# Patient Record
Sex: Male | Born: 1970 | Race: White | Hispanic: No | Marital: Single | Smoking: Former smoker
Health system: Southern US, Community
[De-identification: ages and names within clinical notes are randomized; demographics above are authoritative.]

## PROBLEM LIST (undated history)

## (undated) DIAGNOSIS — I10 Essential (primary) hypertension: Secondary | ICD-10-CM

## (undated) DIAGNOSIS — E785 Hyperlipidemia, unspecified: Secondary | ICD-10-CM

## (undated) HISTORY — DX: Hyperlipidemia, unspecified: E78.5

## (undated) HISTORY — PX: OTHER SURGICAL HISTORY: SHX169

## (undated) HISTORY — DX: Essential (primary) hypertension: I10

---

## 2000-03-05 ENCOUNTER — Encounter: Admission: RE | Admit: 2000-03-05 | Discharge: 2000-03-05 | Payer: Self-pay | Admitting: Family Medicine

## 2000-03-05 ENCOUNTER — Encounter: Payer: Self-pay | Admitting: Family Medicine

## 2001-07-22 ENCOUNTER — Emergency Department (HOSPITAL_COMMUNITY): Admission: EM | Admit: 2001-07-22 | Discharge: 2001-07-22 | Payer: Self-pay | Admitting: Emergency Medicine

## 2006-04-29 ENCOUNTER — Emergency Department (HOSPITAL_COMMUNITY): Admission: EM | Admit: 2006-04-29 | Discharge: 2006-04-29 | Payer: Self-pay | Admitting: *Deleted

## 2007-05-10 ENCOUNTER — Encounter: Admission: RE | Admit: 2007-05-10 | Discharge: 2007-05-10 | Payer: Self-pay | Admitting: Family Medicine

## 2008-05-31 ENCOUNTER — Emergency Department (HOSPITAL_BASED_OUTPATIENT_CLINIC_OR_DEPARTMENT_OTHER): Admission: EM | Admit: 2008-05-31 | Discharge: 2008-05-31 | Payer: Self-pay | Admitting: Emergency Medicine

## 2008-05-31 ENCOUNTER — Ambulatory Visit: Payer: Self-pay | Admitting: Radiology

## 2011-05-16 ENCOUNTER — Encounter: Payer: Self-pay | Admitting: *Deleted

## 2016-08-16 ENCOUNTER — Emergency Department (HOSPITAL_COMMUNITY)
Admission: EM | Admit: 2016-08-16 | Discharge: 2016-08-16 | Disposition: A | Discharge status: Home / Self Care | Payer: Self-pay | Attending: Emergency Medicine | Admitting: Emergency Medicine

## 2016-08-16 ENCOUNTER — Encounter (HOSPITAL_COMMUNITY): Payer: Self-pay | Admitting: *Deleted

## 2016-08-16 ENCOUNTER — Other Ambulatory Visit: Payer: Self-pay

## 2016-08-16 ENCOUNTER — Emergency Department (HOSPITAL_COMMUNITY): Payer: Self-pay

## 2016-08-16 DIAGNOSIS — R079 Chest pain, unspecified: Secondary | ICD-10-CM | POA: Insufficient documentation

## 2016-08-16 DIAGNOSIS — Z87891 Personal history of nicotine dependence: Secondary | ICD-10-CM | POA: Insufficient documentation

## 2016-08-16 DIAGNOSIS — I10 Essential (primary) hypertension: Secondary | ICD-10-CM | POA: Insufficient documentation

## 2016-08-16 LAB — BASIC METABOLIC PANEL
Anion gap: 8 (ref 5–15)
BUN: 9 mg/dL (ref 6–20)
CO2: 27 mmol/L (ref 22–32)
Calcium: 9.2 mg/dL (ref 8.9–10.3)
Chloride: 106 mmol/L (ref 101–111)
Creatinine, Ser: 0.86 mg/dL (ref 0.61–1.24)
Glucose, Bld: 99 mg/dL (ref 65–99)
POTASSIUM: 3.8 mmol/L (ref 3.5–5.1)
SODIUM: 141 mmol/L (ref 135–145)

## 2016-08-16 LAB — CBC
HEMATOCRIT: 45.3 % (ref 39.0–52.0)
Hemoglobin: 16.3 g/dL (ref 13.0–17.0)
MCH: 30.4 pg (ref 26.0–34.0)
MCHC: 36 g/dL (ref 30.0–36.0)
MCV: 84.5 fL (ref 78.0–100.0)
PLATELETS: 241 10*3/uL (ref 150–400)
RBC: 5.36 MIL/uL (ref 4.22–5.81)
RDW: 12.7 % (ref 11.5–15.5)
WBC: 5.6 10*3/uL (ref 4.0–10.5)

## 2016-08-16 LAB — URINALYSIS, ROUTINE W REFLEX MICROSCOPIC
BILIRUBIN URINE: NEGATIVE
Glucose, UA: NEGATIVE mg/dL
Hgb urine dipstick: NEGATIVE
Ketones, ur: NEGATIVE mg/dL
Leukocytes, UA: NEGATIVE
Nitrite: NEGATIVE
PROTEIN: NEGATIVE mg/dL
Specific Gravity, Urine: 1.011 (ref 1.005–1.030)
pH: 7 (ref 5.0–8.0)

## 2016-08-16 LAB — I-STAT TROPONIN, ED
TROPONIN I, POC: 0 ng/mL (ref 0.00–0.08)
Troponin i, poc: 0 ng/mL (ref 0.00–0.08)

## 2016-08-16 NOTE — ED Notes (Signed)
Encouraged pt to provide urine sample.

## 2016-08-16 NOTE — ED Provider Notes (Signed)
[facility] Provider Note   CSN: [id number] Arrival date & time: 08/16/16  [id number]     History   Chief Complaint No chief complaint on file.   HPI Gregory Villarreal is a 46 y.o. male with past medical history of hypertension and dyslipidemia.  HPI  Pt presenting with acute onset of chest tightness that began last night while at rest. Patient states that chest pain subsided before he went to bed. He states he woke up this morning and immediately felt chest tightness with radiation into his left arm, associated with shortness of breath and nausea. He states these have resolved, however the chest tightness is improved but still present. Patient states he has not been taking lisinopril for his hypertension, and has been treating with diet and exercise with success. He states this decision was supported by his primary care. However, last week while at the dentist he noted his blood pressure was 158/95. Yesterday his blood pressure is 150/100 began feeling ringing in his ears and a headache that is since resolved. He denies urinary frequency, back pain, vision changes, abdominal pain, lower extremity edema, headache, or any other symptoms. Denies history of DVT, recent travel, recent trauma or surgery.  Past Medical History:  Diagnosis Date  . Dyslipidemia   . Hypertension     There are no active problems to display for this patient.   Past Surgical History:  Procedure Laterality Date  . none         Home Medications    Prior to Admission medications   Medication Sig Start Date End Date Taking? Authorizing Provider  Artificial Tear Solution (OPTI-TEARS OP) Apply 1 drop to eye as needed (dry eyes).   Yes [provider]  aspirin EC 81 MG tablet Take 81 mg by mouth daily as needed for moderate pain.   Yes [provider]  lisinopril (PRINIVIL,ZESTRIL) 10 MG tablet Take 10 mg by mouth daily. 07/12/15  Yes [provider]  Multiple Vitamin (MULTIVITAMIN)  tablet Take 1 tablet by mouth daily.   Yes [provider]    Family History Family History  Problem Relation Age of Onset  . Stroke Unknown   . Hypertension Unknown   . Diabetes Unknown     Social History Social History  Substance Use Topics  . Smoking status: Former Smoker    Quit date: 01/23/2006  . Smokeless tobacco: Never Used  . Alcohol use 3.5 oz/week    7 Standard drinks or equivalent per week     Allergies   Patient has no known allergies.   Review of Systems Review of Systems  Constitutional: Negative for fever.  HENT: Negative for trouble swallowing.   Eyes: Negative for visual disturbance.  Respiratory: Positive for chest tightness and shortness of breath.   Cardiovascular: Positive for chest pain. Negative for leg swelling.  Gastrointestinal: Positive for nausea. Negative for abdominal pain and vomiting.  Genitourinary: Negative for dysuria and frequency.  Musculoskeletal: Negative for back pain.  Allergic/Immunologic: Negative for immunocompromised state.  Neurological: Negative for headaches.     Physical Exam Updated Vital Signs BP 108/74   Pulse 68   Temp 98 F (36.7 C) (Oral)   Resp 17   Ht 5\' 9"  (1.753 m)   Wt 88.5 kg (195 lb)   SpO2 98%   BMI 28.80 kg/m   Physical Exam  Constitutional: He appears well-developed and well-nourished.  Patient is well-appearing, resting comfortably in bed, not in distress.  HENT:  Head: Normocephalic  and atraumatic.  Mouth/Throat: Oropharynx is clear and moist.  Eyes: Pupils are equal, round, and reactive to light. Conjunctivae and EOM are normal.  Neck: Normal range of motion. Neck supple.  Cardiovascular: Normal rate, regular rhythm, normal heart sounds and intact distal pulses.  Exam reveals no gallop and no friction rub.   No murmur heard. Pulmonary/Chest: Effort normal and breath sounds normal. No respiratory distress. He has no wheezes. He has no rales. He exhibits no tenderness.    Abdominal: Soft. Bowel sounds are normal. He exhibits no distension. There is no tenderness. There is no rebound and no guarding.  Musculoskeletal:  No lower extremity edema  Neurological: He is alert.  Skin: Skin is warm. He is not diaphoretic.  Psychiatric: He has a normal mood and affect. His behavior is normal.  Nursing note and vitals reviewed.    ED Treatments / Results  Labs (all labs ordered are listed, but only abnormal results are displayed) Labs Reviewed  BASIC METABOLIC PANEL  CBC  URINALYSIS, ROUTINE W REFLEX MICROSCOPIC  I-STAT TROPONIN, ED  I-STAT TROPONIN, ED    EKG  EKG Interpretation  Date/Time:  Wednesday August 16 2016 12:58:31 EDT Ventricular Rate:  63 PR Interval:  160 QRS Duration: 102 QT Interval:  403 QTC Calculation: 413 R Axis:   54 Text Interpretation:  Sinus rhythm no acute changes  Confirmed by Crista CurbLiu, Dana [id number]) on 08/16/2016 1:41:12 PM       Radiology Dg Chest 2 View  Result Date: 08/16/2016 CLINICAL DATA:  Chest pain.  Shortness of breath . EXAM: CHEST  2 VIEW COMPARISON:  No recent prior . FINDINGS: Mediastinum and hilar structures normal. Lungs are clear. No pleural effusion or pneumothorax. Heart size normal. Degenerative changes thoracic spine with thoracic spine scoliosis . IMPRESSION: No acute abnormality. Electronically Signed   By: Maisie Fushomas  Register   On: 08/16/2016 08:58    Procedures Procedures (including critical care time)  Medications Ordered in ED Medications - No data to display   Initial Impression / Assessment and Plan / ED Course  I have reviewed the triage vital signs and the nursing notes.  Pertinent labs & imaging results that were available during my care of the patient were reviewed by me and considered in my medical decision making (see chart for details).     Patient with atypical chest pain. Cardiac workup is reassuring. Patient is to be discharged with recommendation to follow up with PCP in regards to  today's hospital visit. Chest pain is not likely of cardiac or pulmonary etiology d/t presentation, PERC negative, VSS, no tracheal deviation, no JVD or new murmur, RRR, breath sounds equal bilaterally, EKG without acute abnormalities x2, negative troponin x2, and negative CXR. Pt has been advised to return to the ED if CP becomes exertional, associated with diaphoresis or nausea, radiates to left jaw/arm, worsens or becomes concerning in any way. Pt encouraged to continue taking his lisinopril as prescribed. Pt appears reliable for follow up and is agreeable to discharge.   Case has been discussed with and seen by Dr. Verdie MosherLiu, who agrees with the above plan to discharge.   Discussed results, findings, treatment and follow up. Patient advised of return precautions. Patient verbalized understanding and agreed with plan.  Final Clinical Impressions(s) / ED Diagnoses   Final diagnoses:  Chest pain in adult    New Prescriptions Discharge Medication List as of 08/16/2016  1:39 PM       Russo, [place]azil[place] N, PA-C 08/16/16 1438  Lavera Guise, MD 08/16/16 [phone]

## 2016-08-16 NOTE — ED Notes (Signed)
Portable xray at bedside.

## 2016-08-16 NOTE — ED Triage Notes (Signed)
PT states was feeling "wierd" yesterday.  Today woke up with sob, pain and L arm pain.  Hx of htn that he has been controlling with diet and exercise.

## 2016-08-16 NOTE — Discharge Instructions (Signed)
Please read instructions below.  Follow up with your primary care provider within 1 week. Return to the ER for new or worsening symptoms; including worsening chest pain, shortness of breath, pain that radiates to the arm or neck, pain or shortness of breath worsened with exertion.

## 2016-09-06 ENCOUNTER — Telehealth: Payer: Self-pay | Admitting: Internal Medicine

## 2016-09-06 NOTE — Telephone Encounter (Signed)
[facility] [place] for appointment on 09/19/16 with Dr Rennis GoldenHilty.  Records put with Dr Blanchie DessertHilty's schedule for 09/19/16. lp

## 2016-09-09 ENCOUNTER — Emergency Department (HOSPITAL_COMMUNITY): Payer: Self-pay

## 2016-09-09 ENCOUNTER — Encounter (HOSPITAL_COMMUNITY): Payer: Self-pay | Admitting: *Deleted

## 2016-09-09 DIAGNOSIS — Z7982 Long term (current) use of aspirin: Secondary | ICD-10-CM | POA: Insufficient documentation

## 2016-09-09 DIAGNOSIS — Z79899 Other long term (current) drug therapy: Secondary | ICD-10-CM | POA: Insufficient documentation

## 2016-09-09 DIAGNOSIS — I10 Essential (primary) hypertension: Secondary | ICD-10-CM | POA: Insufficient documentation

## 2016-09-09 DIAGNOSIS — Z87891 Personal history of nicotine dependence: Secondary | ICD-10-CM | POA: Insufficient documentation

## 2016-09-09 DIAGNOSIS — R079 Chest pain, unspecified: Secondary | ICD-10-CM | POA: Insufficient documentation

## 2016-09-09 LAB — BASIC METABOLIC PANEL
ANION GAP: 9 (ref 5–15)
BUN: 11 mg/dL (ref 6–20)
CALCIUM: 9.1 mg/dL (ref 8.9–10.3)
CO2: 26 mmol/L (ref 22–32)
Chloride: 103 mmol/L (ref 101–111)
Creatinine, Ser: 0.88 mg/dL (ref 0.61–1.24)
Glucose, Bld: 94 mg/dL (ref 65–99)
POTASSIUM: 3.7 mmol/L (ref 3.5–5.1)
Sodium: 138 mmol/L (ref 135–145)

## 2016-09-09 LAB — CBC
HEMATOCRIT: 43.8 % (ref 39.0–52.0)
HEMOGLOBIN: 15.9 g/dL (ref 13.0–17.0)
MCH: 30.4 pg (ref 26.0–34.0)
MCHC: 36.3 g/dL — ABNORMAL HIGH (ref 30.0–36.0)
MCV: 83.7 fL (ref 78.0–100.0)
Platelets: 268 10*3/uL (ref 150–400)
RBC: 5.23 MIL/uL (ref 4.22–5.81)
RDW: 12.2 % (ref 11.5–15.5)
WBC: 7.9 10*3/uL (ref 4.0–10.5)

## 2016-09-09 LAB — I-STAT TROPONIN, ED: TROPONIN I, POC: 0.01 ng/mL (ref 0.00–0.08)

## 2016-09-09 NOTE — ED Triage Notes (Signed)
Pt has been having L chest pain, radiating to L arm, L ribcage, and L side of neck ongoing x 3 weeks. Pt has been seen here for same and followup with PCP as instructed; has appt with cardiologist on 8/28. Also endorses sob and nausea

## 2016-09-10 ENCOUNTER — Emergency Department (HOSPITAL_COMMUNITY)
Admission: EM | Admit: 2016-09-10 | Discharge: 2016-09-10 | Disposition: A | Discharge status: Home / Self Care | Payer: Self-pay | Attending: Emergency Medicine | Admitting: Emergency Medicine

## 2016-09-10 DIAGNOSIS — R079 Chest pain, unspecified: Secondary | ICD-10-CM

## 2016-09-10 LAB — I-STAT TROPONIN, ED: Troponin i, poc: 0 ng/mL (ref 0.00–0.08)

## 2016-09-10 NOTE — ED Notes (Signed)
ED Provider at bedside. 

## 2016-09-10 NOTE — ED Provider Notes (Signed)
[facility] Provider Note   CSN: [id number] Arrival date & time: 09/09/16  2103     History   Chief Complaint Chief Complaint  Patient presents with  . Chest Pain    HPI Gregory Villarreal is a 46 y.o. male.  The history is provided by the patient and medical records. No language interpreter was used.  Chest Pain   Pertinent negatives include no palpitations and no shortness of breath.   Gregory Villarreal is a 46 y.o. male  with a PMH of HTN, HLD who presents to the Emergency Department complaining of chest pain for the last 3 weeks. He was seen in the emergency department at initial onset where he received reassuring cardiac workup and was told to follow-up with his primary care provider. He did follow up on 8/26 where he was started on baby aspirin and Zantac and told to see a cardiologist. He has scheduled a cardiology appointment for 8/28. He describes his pain as a constant tightness that gets worse with movement and outpatient. That sometimes the pain will feel tighter and more severe Intermittently associated with a feeling of lightheadedness and radiation to the left arm. Not exertional. Today, he was just finishing nursing on the couch pain acutely worsened. Not associated with nausea, vomiting or diaphoresis. No back pain or trouble breathing. No medications taken prior to arrival for symptoms however he has been compliant with Zantac and ASA therapy.   Past Medical History:  Diagnosis Date  . Dyslipidemia   . Hypertension     There are no active problems to display for this patient.   Past Surgical History:  Procedure Laterality Date  . none         Home Medications    Prior to Admission medications   Medication Sig Start Date End Date Taking? Authorizing Provider  Artificial Tear Solution (OPTI-TEARS OP) Apply 1 drop to eye as needed (dry eyes).    [provider]  aspirin EC 81 MG tablet Take 81 mg by mouth daily as needed for moderate pain.     [provider]  lisinopril (PRINIVIL,ZESTRIL) 10 MG tablet Take 10 mg by mouth daily. 07/12/15   [provider]  Multiple Vitamin (MULTIVITAMIN) tablet Take 1 tablet by mouth daily.    [provider]    Family History Family History  Problem Relation Age of Onset  . Stroke Unknown   . Hypertension Unknown   . Diabetes Unknown     Social History Social History  Substance Use Topics  . Smoking status: Former Smoker    Quit date: 01/23/2006  . Smokeless tobacco: Never Used  . Alcohol use 3.5 oz/week    7 Standard drinks or equivalent per week     Allergies   Patient has no known allergies.   Review of Systems Review of Systems  Respiratory: Negative for shortness of breath.   Cardiovascular: Positive for chest pain. Negative for palpitations and leg swelling.  Neurological: Positive for light-headedness. Negative for syncope.  All other systems reviewed and are negative.    Physical Exam Updated Vital Signs BP 128/86   Pulse 65   Temp 98.3 F (36.8 C) (Oral)   Resp 14   SpO2 99%   Physical Exam  Constitutional: He is oriented to person, place, and time. He appears well-developed and well-nourished. No distress.  HENT:  Head: Normocephalic and atraumatic.  Neck: No JVD present.  Cardiovascular: Normal rate, regular rhythm and normal heart sounds.  No murmur heard. Pulmonary/Chest: Effort normal and breath sounds normal. No respiratory distress. He has no wheezes. He has no rales. He exhibits tenderness.  Abdominal: Soft. Bowel sounds are normal. He exhibits no distension. There is no tenderness.  Musculoskeletal: He exhibits no edema.  Neurological: He is alert and oriented to person, place, and time.  Skin: Skin is warm and dry.  Nursing note and vitals reviewed.    ED Treatments / Results  Labs (all labs ordered are listed, but only abnormal results are displayed) Labs Reviewed  CBC - Abnormal; Notable for the following:        Result Value   MCHC 36.3 (*)    All other components within normal limits  BASIC METABOLIC PANEL  I-STAT TROPONIN, ED  I-STAT TROPONIN, ED    EKG  EKG Interpretation  Date/Time:  Saturday September 09 2016 21:09:53 EDT Ventricular Rate:  63 PR Interval:  152 QRS Duration: 92 QT Interval:  410 QTC Calculation: 419 R Axis:   -18 Text Interpretation:  Normal sinus rhythm Normal ECG No significant change since last tracing Confirmed by Melene Plan [phone]) on 09/10/2016 12:44:23 AM       Radiology Dg Chest 2 View  Result Date: 09/09/2016 CLINICAL DATA:  Chest pain and shortness of breath EXAM: CHEST  2 VIEW COMPARISON:  August 16, 2016 FINDINGS: There is no edema or consolidation. The heart size and pulmonary vascularity are normal. No adenopathy. There is mild degenerative change in the thoracic spine. IMPRESSION: No edema or consolidation. Electronically Signed   By: Bretta Bang III M.D.   On: 09/09/2016 21:42    Procedures Procedures (including critical care time)  Medications Ordered in ED Medications - No data to display   Initial Impression / Assessment and Plan / ED Course  I have reviewed the triage vital signs and the nursing notes.  Pertinent labs & imaging results that were available during my care of the patient were reviewed by me and considered in my medical decision making (see chart for details).    Gregory Villarreal is a 46 y.o. male who presents to ED for constant chest pain x 3 weeks. Seen in ED on 7/25. Chart reviewed - reassuring work up. Then saw PCP who has started him on Zantac and 81 mg asa which he has been compliant with. Today, patient is afebrile, hemodynamically stable with tenderness to palpation along the chest wall. Labs reviewed and reassuring with negative troponin 2. Chest x-ray with no acute abnormalities. EKG non-ischemic. PERC negative. Low risk heart score. Patient's symptoms unlikely to be of cardiac etiology. Labs and imaging reviewed  again prior to discharge. Patient has been advised to return to the ED if development of any exertional chest pain, trouble breathing, new/worsening symptoms or for any additional concerns. Evaluation does not show pathology that would require ongoing emergent intervention or inpatient treatment. Encouraged to keep scheduled appointment with cards. Patient understands return precautions and follow up plan. All questions answered.   Final Clinical Impressions(s) / ED Diagnoses   Final diagnoses:  Chest pain with low risk for cardiac etiology    New Prescriptions New Prescriptions   No medications on file     Thelonious Kauffmann, Chase Picket, PA-C 09/10/16 0140    Melene Plan, DO 09/10/16 [phone]

## 2016-09-10 NOTE — Discharge Instructions (Signed)
It was my pleasure taking care of you today!   You were seen in the Emergency Department today for chest pain.  As we have discussed, today?s blood work and imagine are normal, but you may require further testing.  Keep your appointment with the cardiologist.   Continue to take your regular medications including Zantac and aspirin.   Return to the Emergency Department if you experience any exertional chest pain, difficulty breathing, sudden sweating, or other symptoms that concern you.

## 2016-09-19 ENCOUNTER — Ambulatory Visit (INDEPENDENT_AMBULATORY_CARE_PROVIDER_SITE_OTHER): Payer: Self-pay | Admitting: Internal Medicine

## 2016-09-19 ENCOUNTER — Encounter: Payer: Self-pay | Admitting: Internal Medicine

## 2016-09-19 VITALS — BP 126/84 | HR 58 | Ht 69.0 in | Wt 196.2 lb

## 2016-09-19 DIAGNOSIS — R0789 Other chest pain: Secondary | ICD-10-CM

## 2016-09-19 DIAGNOSIS — M7542 Impingement syndrome of left shoulder: Secondary | ICD-10-CM

## 2016-09-19 DIAGNOSIS — I10 Essential (primary) hypertension: Secondary | ICD-10-CM | POA: Insufficient documentation

## 2016-09-19 MED ORDER — IBUPROFEN 800 MG PO TABS: 800.0000 mg | ORAL_TABLET | Freq: Two times a day (BID) | ORAL | 0 refills | Status: AC

## 2016-09-19 NOTE — Progress Notes (Signed)
OFFICE CONSULT NOTE  Chief Complaint:  Chest pain  [facility] Phys[facility]: [facility]  HPI:  Gregory Villarreal is a 46 y.o. male who is being seen today for the evaluation of chest  at the request of Gregory Villarreal, *. Gregory Villarreal is a pleasant 46 year old male who recently is experiencing chest pain. He said that this initially started with some tenderness in the left chest over the left sternal costal margin. His symptoms are somewhat worse when taking a deep breath or lifting. He works in [employer] and does a lot of lifting of heavy books and items. He struggled with blood pressure for some time and was on and off of blood pressure medication due to changes in diet and exercise. Recently he went back on blood pressure medication for elevated blood pressures. He does have a history of stroke in his family, his dad who died of probably an embolic stroke from the carotids at age 46. In the past is told he has dyslipidemia but is not a medication for that. He reports some discomfort in his left arm and difficulty with grip strength in the left arm. He has some discomfort in the left shoulder. He cannot recall specific injury. He says he gets some numbness and tingling in his fingers and both hands, particularly after sleeping at night but realizes that he sleeps with his hands curled up words. This episode of chest discomfort he's had several times and is occluded very intense, sharp pain which is in the left chest sometimes radiating to the left shoulder and down the left arm. It was associated with some nausea, diaphoresis and tachycardia. He was also very anxious. He reports that he's had panic attacks in the past, but he feels that this is different. He's been to the emergency department several times a workup was negative with negative troponins and normal EKG. A recent episode included some left leg pain as well but he's had no further episodes of  that.  PMHx:  Past Medical History:  Diagnosis Date  . Dyslipidemia   . Hypertension     Past Surgical History:  Procedure Laterality Date  . none      FAMHx:  Family History  Problem Relation Age of Onset  . Stroke Unknown   . Hypertension Unknown   . Diabetes Unknown     SOCHx:   reports that he quit smoking about 10 years ago. He has never used smokeless tobacco. He reports that he drinks about 3.5 oz of alcohol per week . He reports that he does not use drugs.  ALLERGIES:  No Known Allergies  ROS: Pertinent items noted in HPI and remainder of comprehensive ROS otherwise negative.  HOME MEDS: Current Outpatient Prescriptions on File Prior to Visit  Medication Sig Dispense Refill  . Artificial Tear Solution (OPTI-TEARS OP) Apply 1 drop to eye as needed (dry eyes).    Marland Kitchen aspirin EC 81 MG tablet Take 81 mg by mouth daily as needed for moderate pain.    Marland Kitchen lisinopril (PRINIVIL,ZESTRIL) 10 MG tablet Take 10 mg by mouth daily.    . Multiple Vitamin (MULTIVITAMIN) tablet Take 1 tablet by mouth daily.     No current facility-administered medications on file prior to visit.     LABS/IMAGING: No results found for this or any previous visit (from the past 48 hour(s)). No results found.  LIPID PANEL: No results found for: CHOL, TRIG, HDL, CHOLHDL, VLDL, LDLCALC, LDLDIRECT  WEIGHTS:  Wt Readings from Last 3 Encounters:  09/19/16 196 lb 3.2 oz (89 kg)  08/16/16 195 lb (88.5 kg)    VITALS: BP 126/84   Pulse (!) 58   Ht 5\' 9"  (1.753 m)   Wt 196 lb 3.2 oz (89 kg)   BMI 28.97 kg/m   EXAM: General appearance: alert and no distress Neck: no JVD, supple, symmetrical, trachea midline and thyroid not enlarged, symmetric, no tenderness/mass/nodules Lungs: clear to auscultation bilaterally Heart: regular rate and rhythm Abdomen: soft, non-tender; bowel sounds normal; no masses,  no organomegaly Extremities: extremities normal, atraumatic, no cyanosis or edema and Pain on  palpation over the left sternal costal border, pain with extension and external rotation of the left shoulder with mild impingement Pulses: 2+ and symmetric Skin: Skin color, texture, turgor normal. No rashes or lesions Neurologic: Motor: 4 out of 5 strength in the left arm with extension, 5 out of 5 on the right Psych: Mildly anxious  EKG: Sinus bradycardia 58 - personally reviewed  ASSESSMENT: 1. Musculoskeletal pain-possible costochondritis or referred pain from left shoulder impingement 2. Family history of cerebrovascular disease 3. Hypertension  PLAN: 1.   Gregory Villarreal is describing a musculoskeletal pain. Is worse with deep breathing and the pain in the left chest is tender particularly there is point tenderness along the left sternal border. This could be neuropathic pain or possibly a costochondritis. He also has some left shoulder impingement with weakness in the left arm and may have a partial rotator cuff tear or strain. I'm advising no heavy lifting for the next couple of weeks and we'll start him on a trial of nonsteroidal anti-inflammatories. He should not take daily aspirin during this time. I've also advised him to use his Zantac daily for stomach protection. If his symptoms are not improved, I'm happy to see him back as needed but he should seek out an orthopedist for further evaluation of the shoulder.  Thanks for the kind referral.  Gregory Nose, MD, [facility]  [place]  [facility]  Attending Cardiologist  Direct Dial: [phone]  Fax: [phone]  Website:  [email or web address] Nicely Gregory Vill[facility]real 09/19/2016, 5:13 PM

## 2016-09-19 NOTE — Patient Instructions (Signed)
Your physician has recommended you make the following change in your medication:  -- START ibuprofen 800mg  twice daily - take for 2 weeks  -- hold aspirin while taking this  Your physician recommends that you schedule a follow-up appointment as needed with Dr. Rennis Golden

## 2018-07-04 IMAGING — CR DG CHEST 2V
2 series · 2 of 2 positions shown · non-contrast
Comparison: No recent prior .

CLINICAL DATA: Chest pain.  Shortness of breath .

EXAM:
CHEST  2 VIEW

[chest pa]
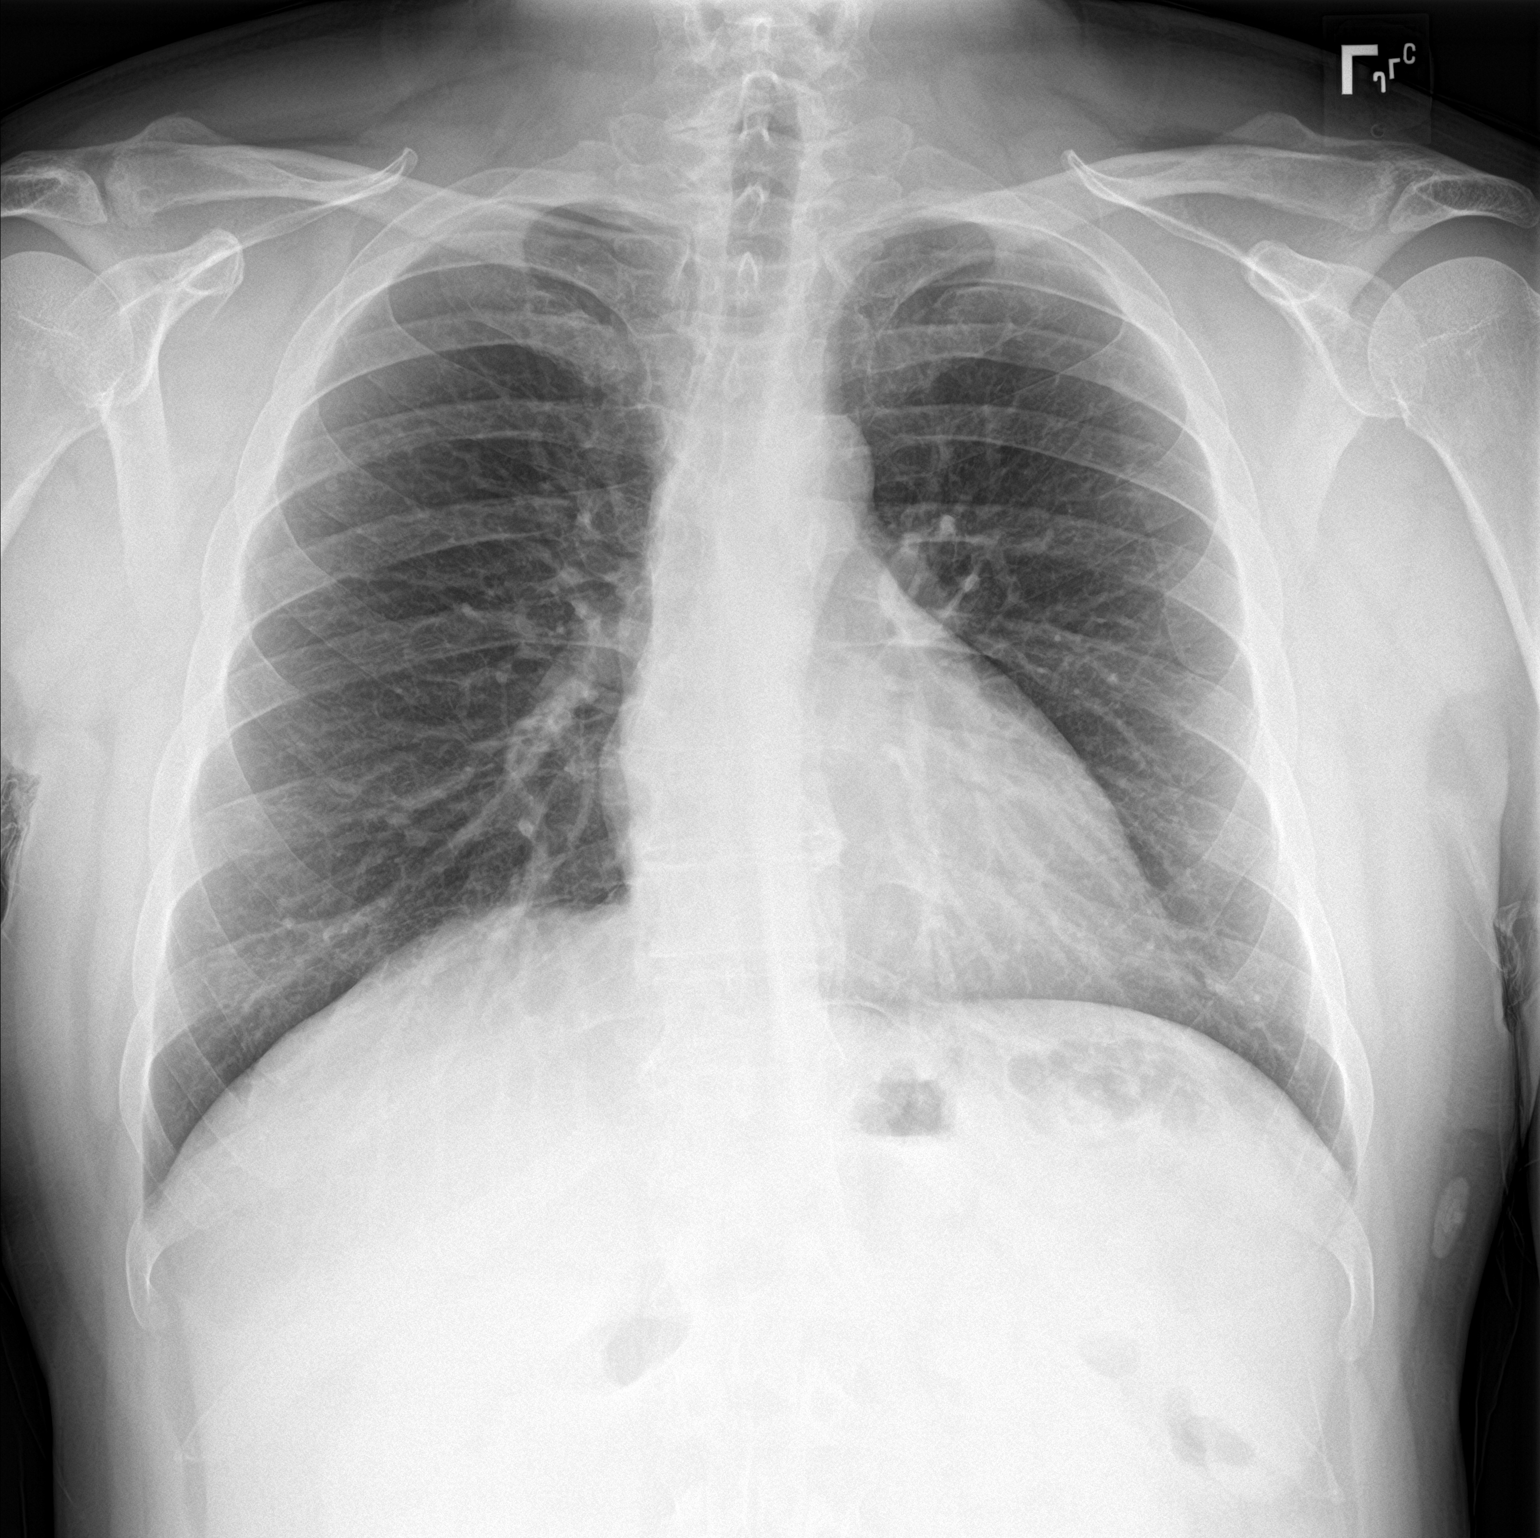

[chest lat]
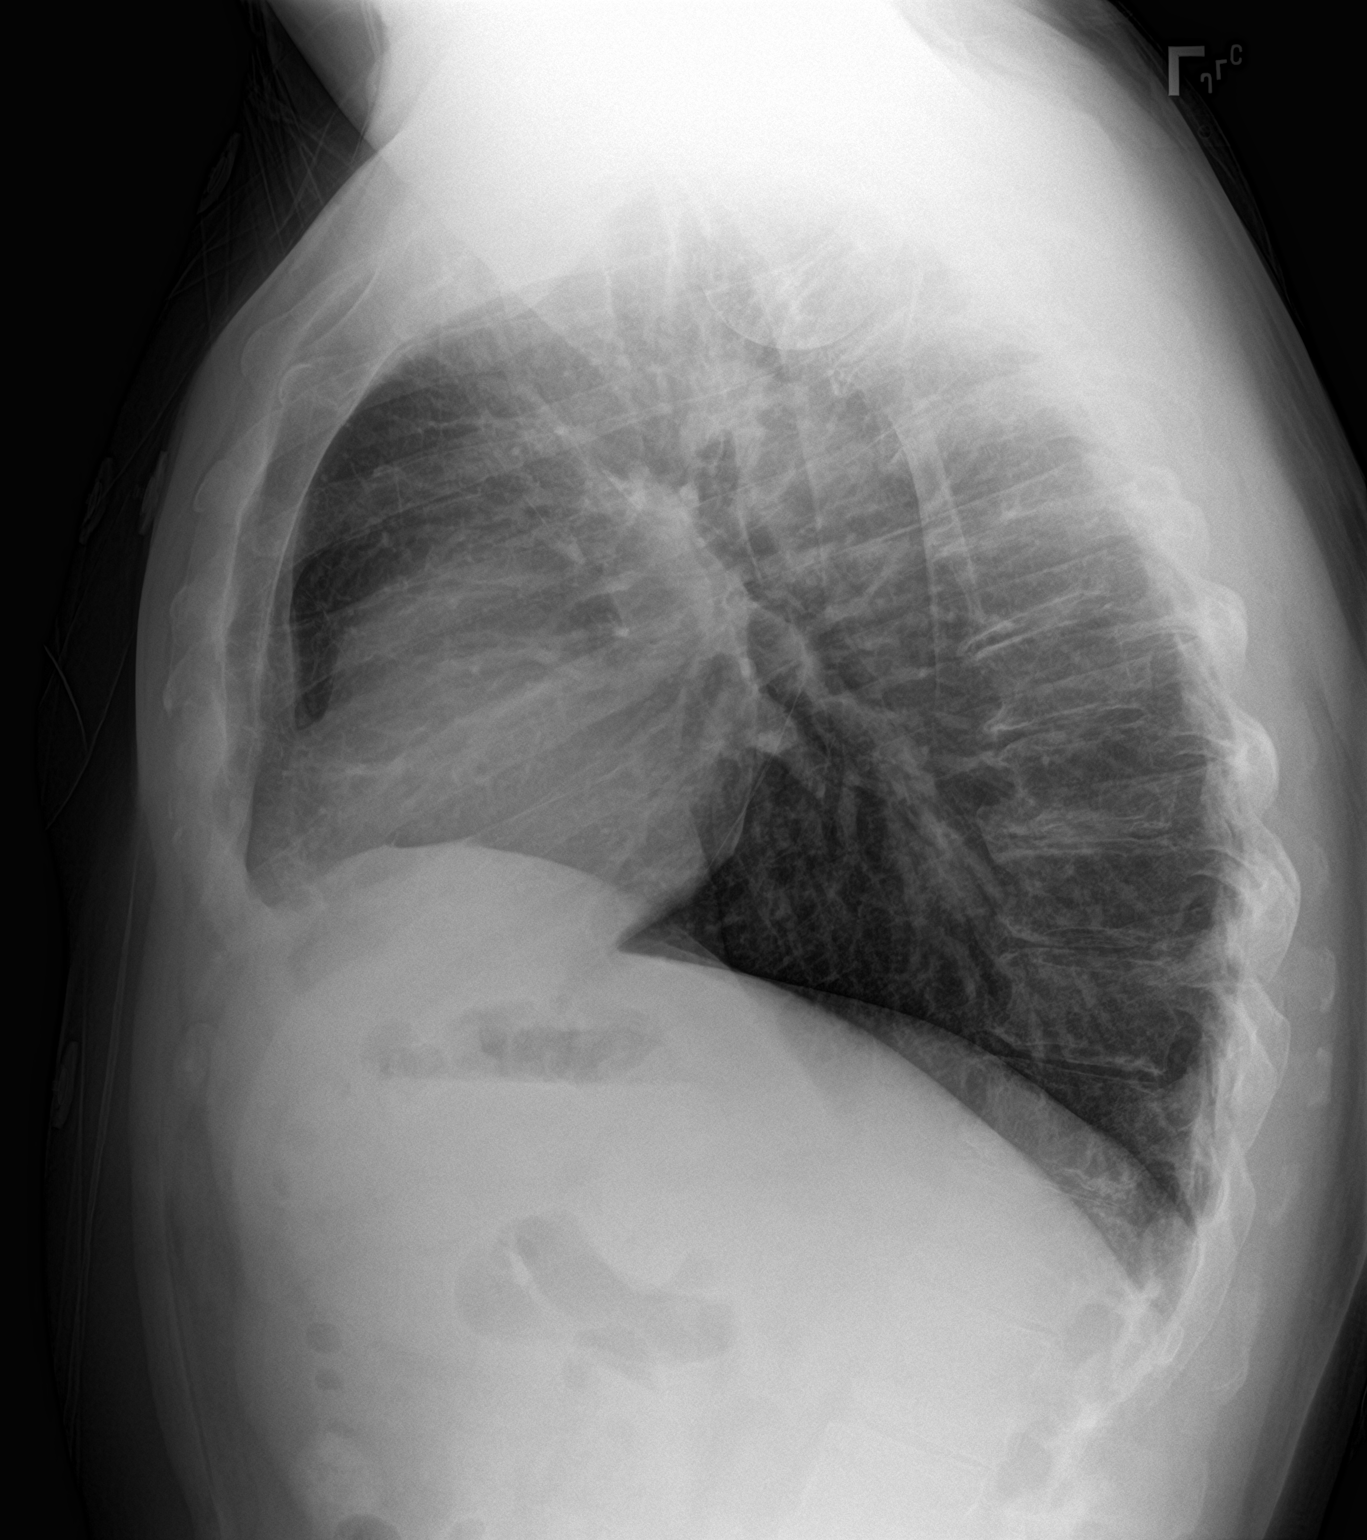

[2 of 2 positions shown; findings below may reference images not displayed]

FINDINGS: Mediastinum and hilar structures normal. Lungs are clear. No pleural
effusion or pneumothorax. Heart size normal. Degenerative changes
thoracic spine with thoracic spine scoliosis .
IMPRESSION: No acute abnormality.

## 2018-07-28 IMAGING — CR DG CHEST 2V
2 series · 2 of 2 positions shown · non-contrast
Comparison: August 16, 2016

CLINICAL DATA: Chest pain and shortness of breath

EXAM:
CHEST  2 VIEW

[chest pa]
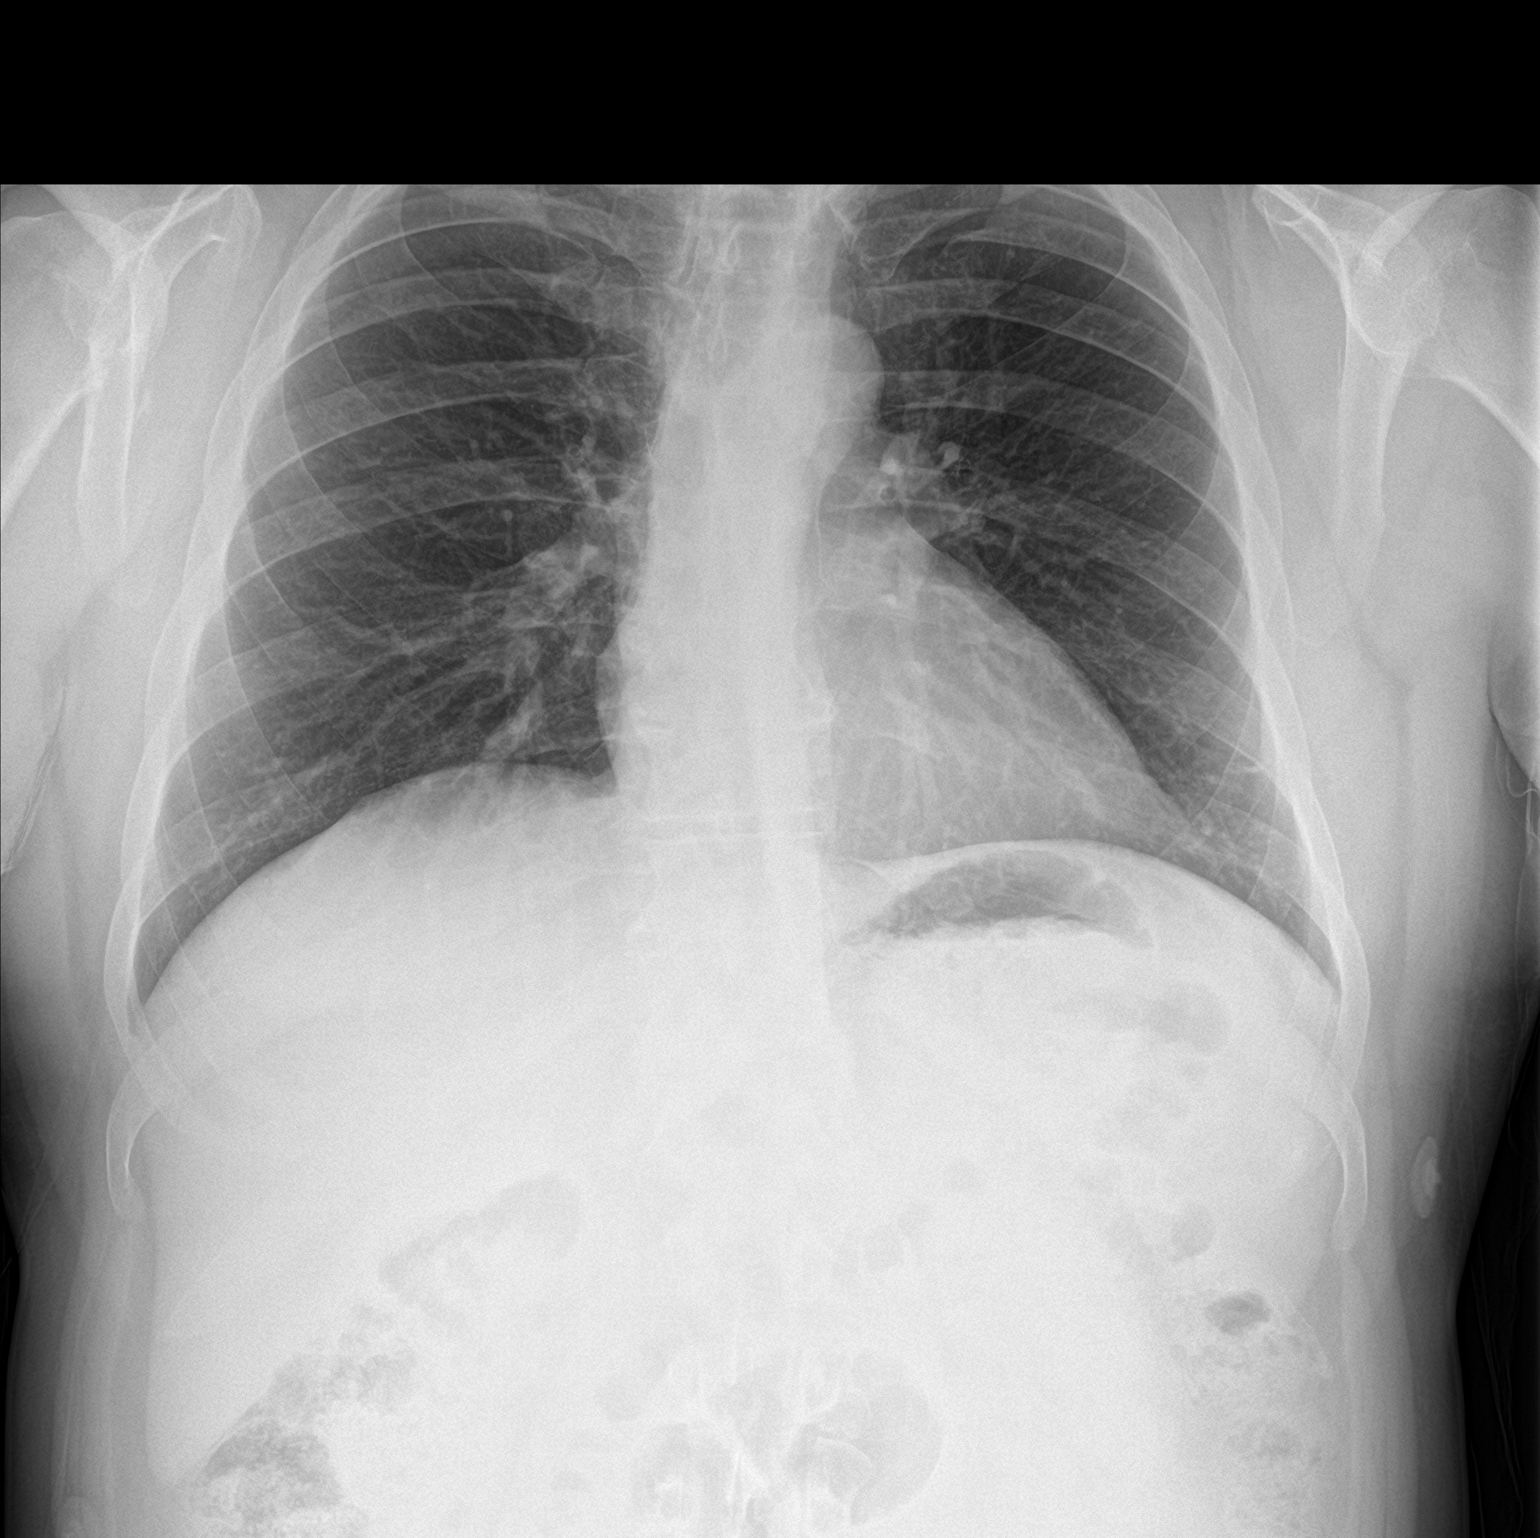

[chest lat]
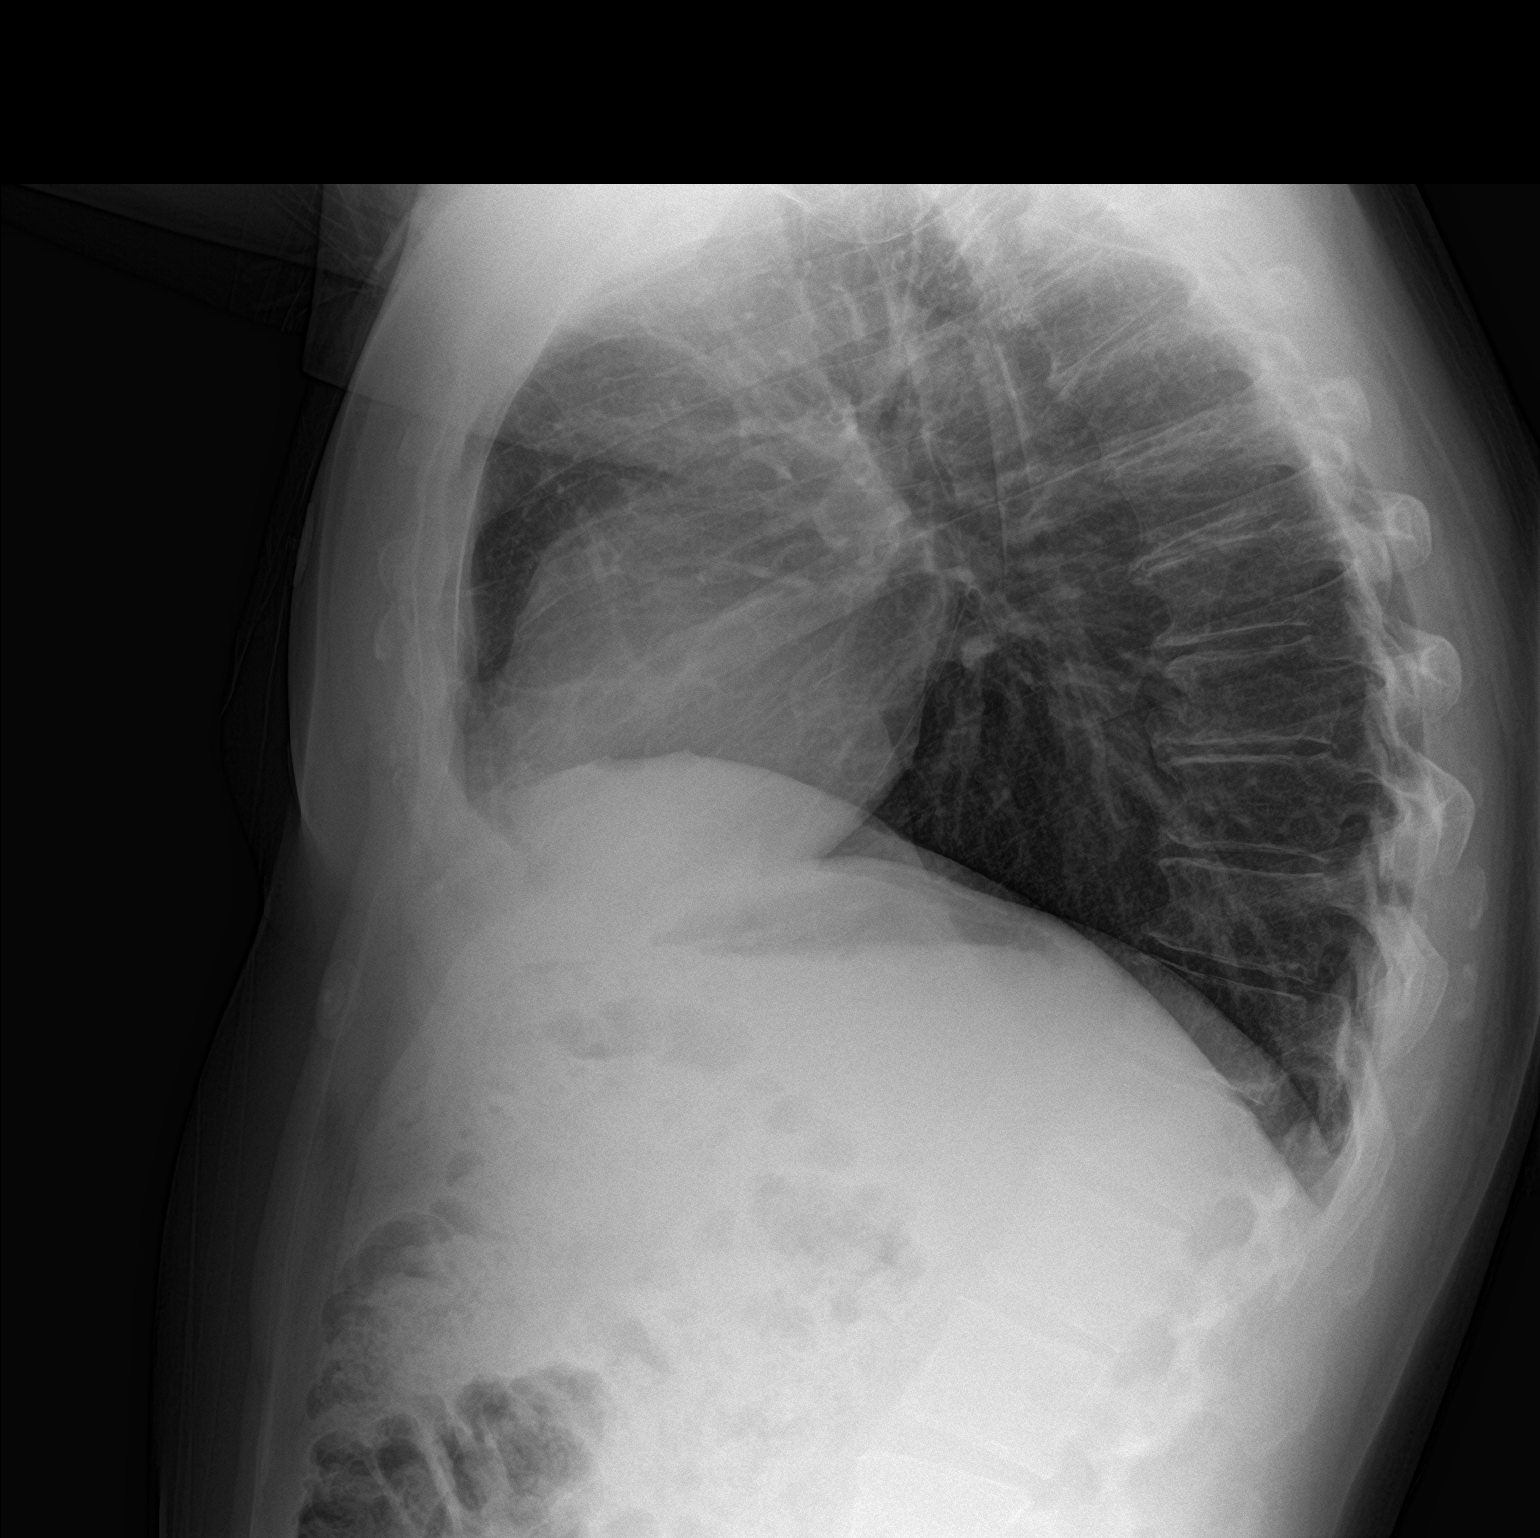

[2 of 2 positions shown; findings below may reference images not displayed]

FINDINGS: There is no edema or consolidation. The heart size and pulmonary
vascularity are normal. No adenopathy. There is mild degenerative
change in the thoracic spine.
IMPRESSION: No edema or consolidation.

## 2019-06-26 DIAGNOSIS — I10 Essential (primary) hypertension: Secondary | ICD-10-CM | POA: Diagnosis not present

## 2019-06-26 DIAGNOSIS — F17200 Nicotine dependence, unspecified, uncomplicated: Secondary | ICD-10-CM | POA: Diagnosis not present

## 2019-06-26 DIAGNOSIS — R1084 Generalized abdominal pain: Secondary | ICD-10-CM | POA: Diagnosis not present

## 2019-06-27 ENCOUNTER — Other Ambulatory Visit: Payer: Self-pay | Admitting: Internal Medicine

## 2019-06-27 DIAGNOSIS — R1084 Generalized abdominal pain: Secondary | ICD-10-CM

## 2019-07-03 ENCOUNTER — Ambulatory Visit
Admission: RE | Admit: 2019-07-03 | Discharge: 2019-07-03 | Disposition: A | Discharge status: Home / Self Care | Payer: BC Managed Care – PPO | Source: Ambulatory Visit | Attending: Internal Medicine | Admitting: Internal Medicine

## 2019-07-03 DIAGNOSIS — R1084 Generalized abdominal pain: Secondary | ICD-10-CM

## 2019-07-25 DIAGNOSIS — Z13220 Encounter for screening for lipoid disorders: Secondary | ICD-10-CM | POA: Diagnosis not present

## 2019-07-25 DIAGNOSIS — Z0000 Encounter for general adult medical examination without abnormal findings: Secondary | ICD-10-CM | POA: Diagnosis not present

## 2019-08-02 DIAGNOSIS — R109 Unspecified abdominal pain: Secondary | ICD-10-CM | POA: Diagnosis not present

## 2019-09-02 DIAGNOSIS — K639 Disease of intestine, unspecified: Secondary | ICD-10-CM | POA: Diagnosis not present

## 2019-09-02 DIAGNOSIS — R109 Unspecified abdominal pain: Secondary | ICD-10-CM | POA: Diagnosis not present

## 2020-01-30 DIAGNOSIS — R109 Unspecified abdominal pain: Secondary | ICD-10-CM | POA: Diagnosis not present

## 2020-01-30 DIAGNOSIS — I10 Essential (primary) hypertension: Secondary | ICD-10-CM | POA: Diagnosis not present

## 2020-08-17 DIAGNOSIS — Z23 Encounter for immunization: Secondary | ICD-10-CM | POA: Diagnosis not present

## 2020-08-17 DIAGNOSIS — E785 Hyperlipidemia, unspecified: Secondary | ICD-10-CM | POA: Diagnosis not present

## 2020-08-17 DIAGNOSIS — Z13220 Encounter for screening for lipoid disorders: Secondary | ICD-10-CM | POA: Diagnosis not present

## 2020-08-17 DIAGNOSIS — Z0000 Encounter for general adult medical examination without abnormal findings: Secondary | ICD-10-CM | POA: Diagnosis not present

## 2021-02-16 DIAGNOSIS — E785 Hyperlipidemia, unspecified: Secondary | ICD-10-CM | POA: Diagnosis not present

## 2021-02-16 DIAGNOSIS — R634 Abnormal weight loss: Secondary | ICD-10-CM | POA: Diagnosis not present

## 2021-02-16 DIAGNOSIS — I10 Essential (primary) hypertension: Secondary | ICD-10-CM | POA: Diagnosis not present

## 2021-05-20 IMAGING — US US ABDOMEN LIMITED
1 series · 14 of 25 positions shown · non-contrast
Comparison: None.

CLINICAL DATA: Generalized abdominal pain x2 months.

EXAM:
ULTRASOUND ABDOMEN LIMITED RIGHT UPPER QUADRANT

[Series 1: us abdomen limited · 0.28mm/px · 14 of 33 slices shown]
[im 1/33]
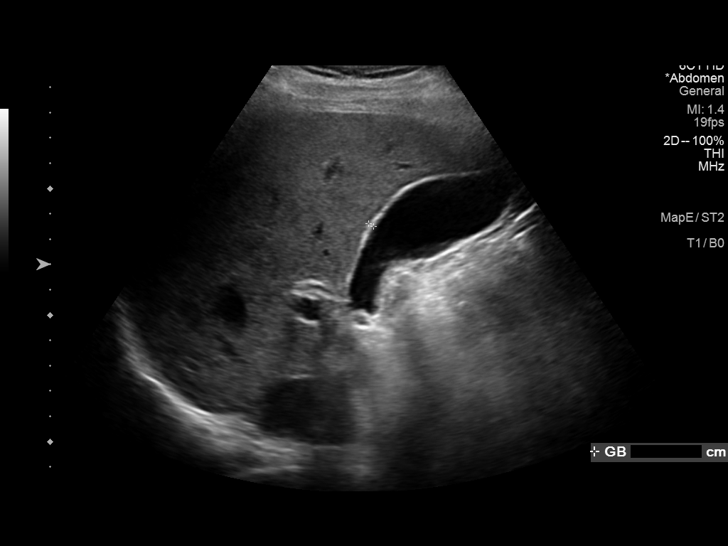
[im 3/33]
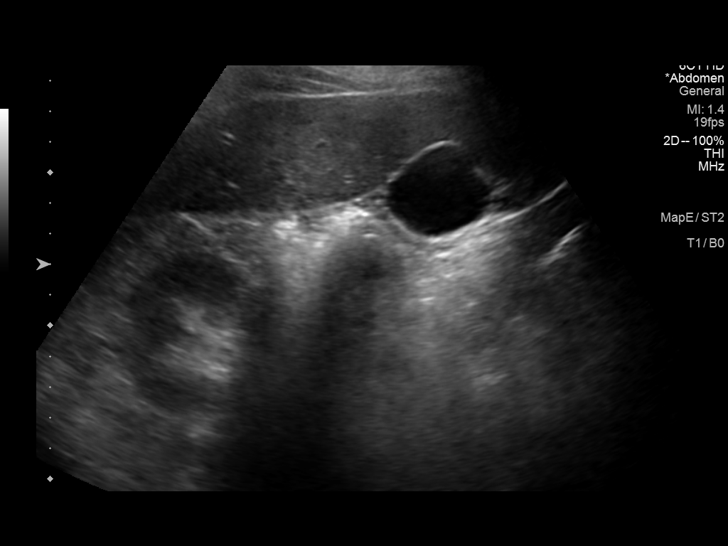
[im 6/33]
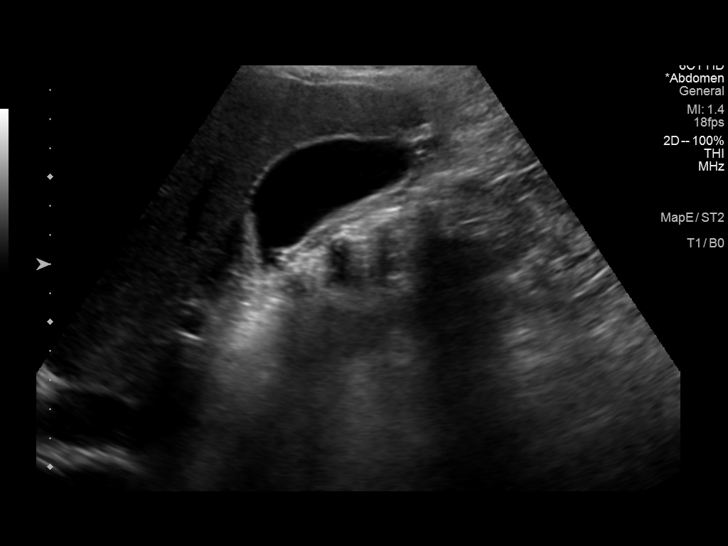
[im 9/33]
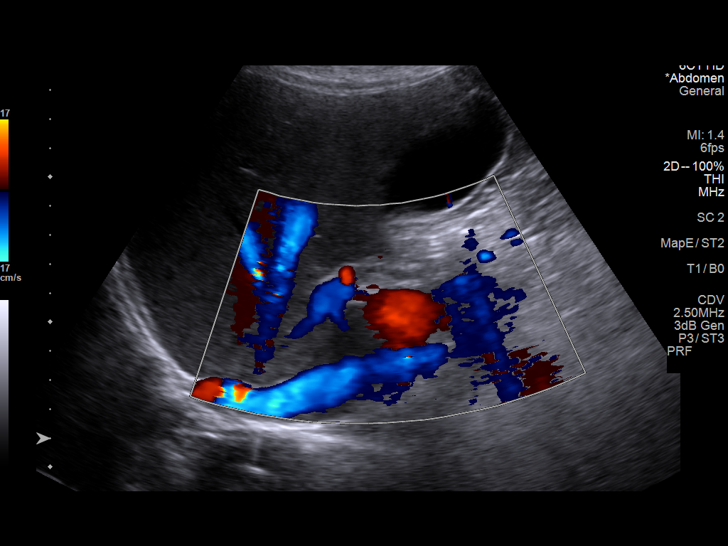
[im 11/33]
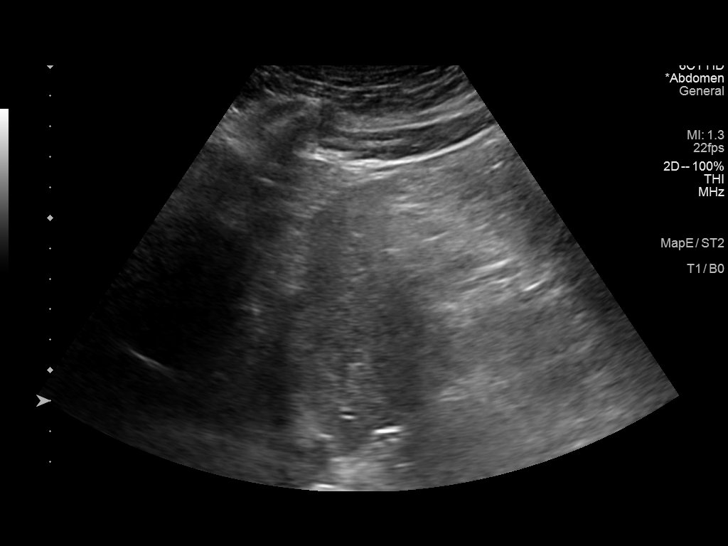
[im 13/33]
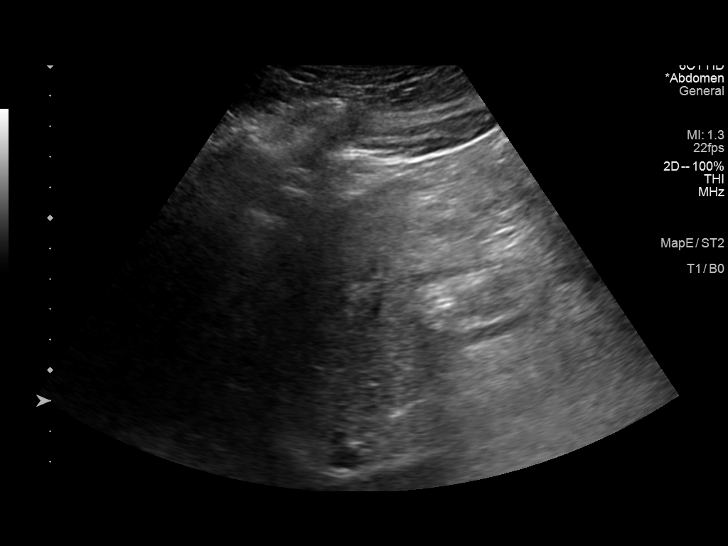
[im 15/33]
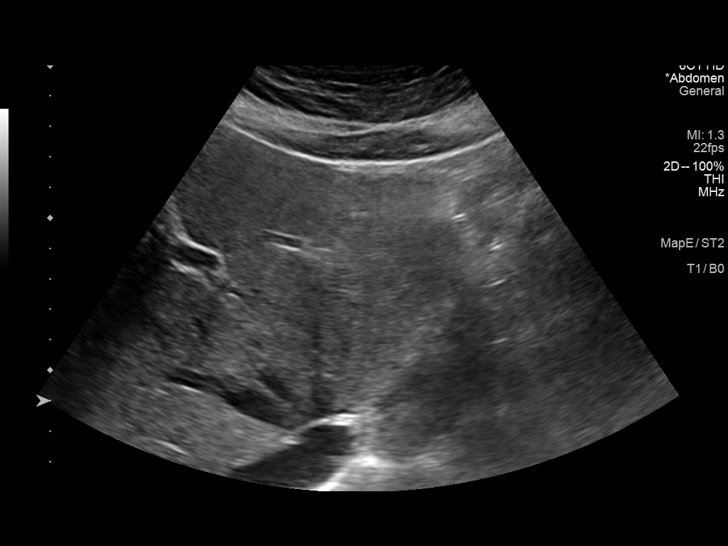
[im 18/33]
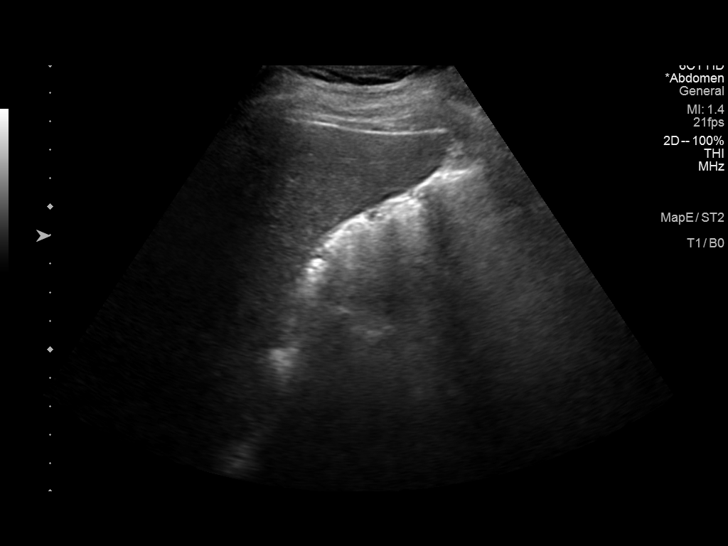
[im 21/33]
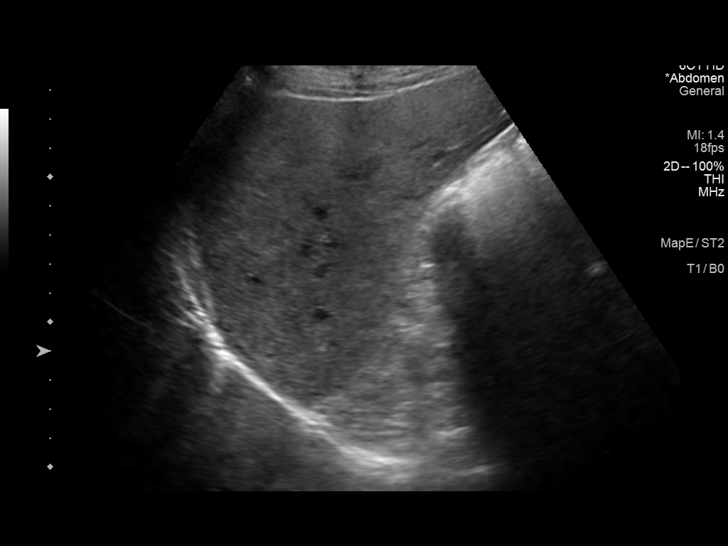
[im 22/33]
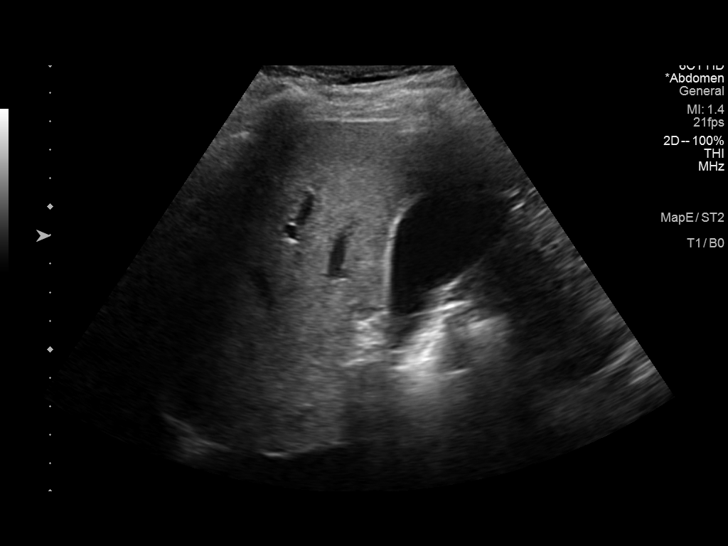
[im 25/33]
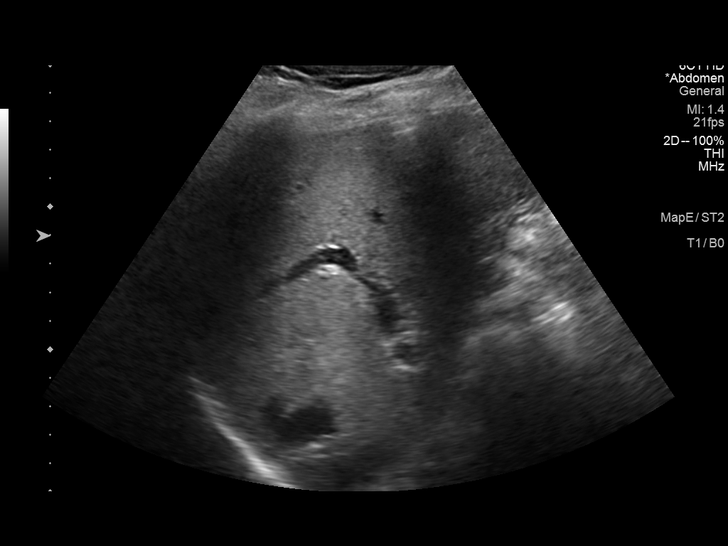
[im 27/33]
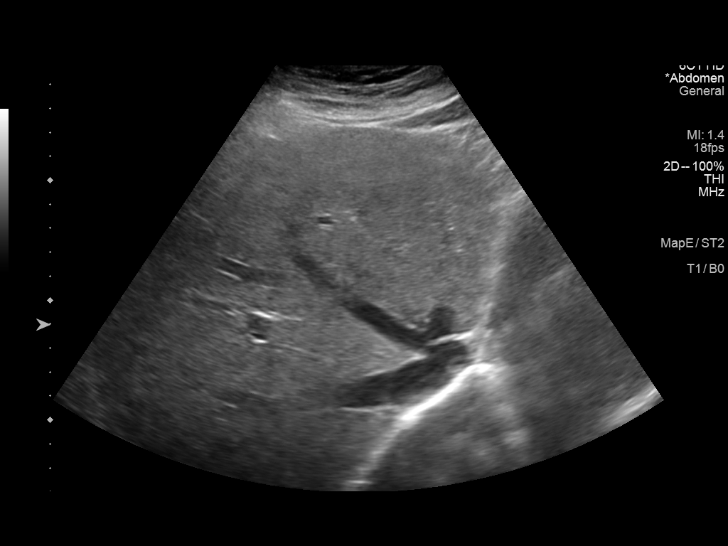
[im 30/33]
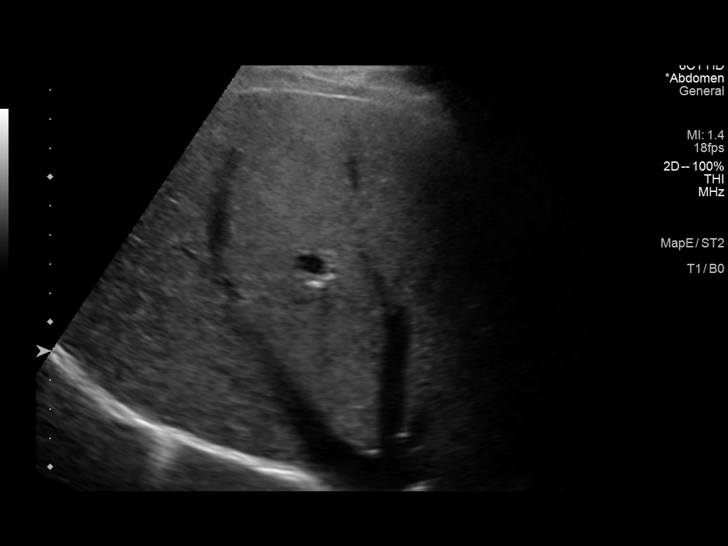
[im 33/33]
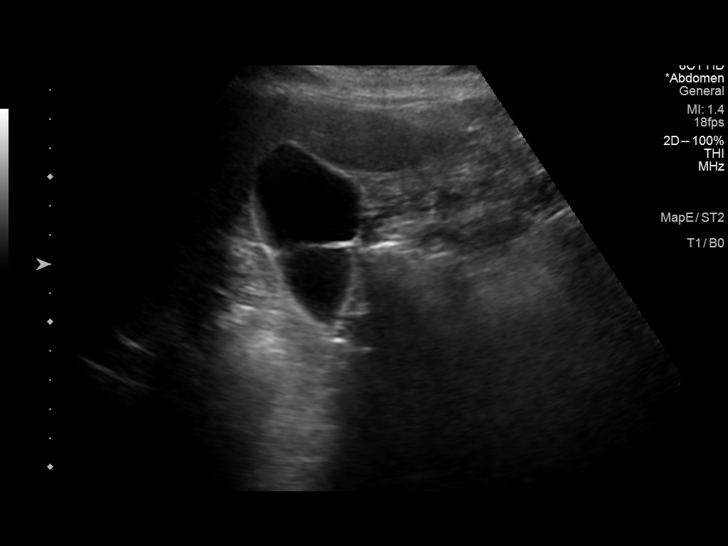

[14 of 25 positions shown; findings below may reference images not displayed]

FINDINGS: Gallbladder:

No gallstones or wall thickening visualized (1.2 mm). No sonographic
Murphy sign noted by sonographer.

Common bile duct:

Diameter: 3.2 mm

Liver:

No focal lesion identified. There is diffusely increased
echogenicity of the liver parenchyma. Portal vein is patent on color
Doppler imaging with normal direction of blood flow towards the
liver.

Other: None.
IMPRESSION: Fatty liver.

## 2021-10-19 DIAGNOSIS — E785 Hyperlipidemia, unspecified: Secondary | ICD-10-CM | POA: Diagnosis not present

## 2021-10-19 DIAGNOSIS — Z0000 Encounter for general adult medical examination without abnormal findings: Secondary | ICD-10-CM | POA: Diagnosis not present

## 2021-10-28 DIAGNOSIS — Z03818 Encounter for observation for suspected exposure to other biological agents ruled out: Secondary | ICD-10-CM | POA: Diagnosis not present

## 2021-10-28 DIAGNOSIS — J029 Acute pharyngitis, unspecified: Secondary | ICD-10-CM | POA: Diagnosis not present

## 2021-10-28 DIAGNOSIS — R051 Acute cough: Secondary | ICD-10-CM | POA: Diagnosis not present

## 2022-10-30 ENCOUNTER — Other Ambulatory Visit (HOSPITAL_BASED_OUTPATIENT_CLINIC_OR_DEPARTMENT_OTHER): Payer: Self-pay | Admitting: Internal Medicine

## 2022-10-30 DIAGNOSIS — Z87891 Personal history of nicotine dependence: Secondary | ICD-10-CM | POA: Diagnosis not present

## 2022-10-30 DIAGNOSIS — Z125 Encounter for screening for malignant neoplasm of prostate: Secondary | ICD-10-CM | POA: Diagnosis not present

## 2022-10-30 DIAGNOSIS — L209 Atopic dermatitis, unspecified: Secondary | ICD-10-CM | POA: Diagnosis not present

## 2022-10-30 DIAGNOSIS — E785 Hyperlipidemia, unspecified: Secondary | ICD-10-CM | POA: Diagnosis not present

## 2022-10-30 DIAGNOSIS — Z0000 Encounter for general adult medical examination without abnormal findings: Secondary | ICD-10-CM | POA: Diagnosis not present

## 2022-12-01 ENCOUNTER — Encounter (HOSPITAL_BASED_OUTPATIENT_CLINIC_OR_DEPARTMENT_OTHER): Payer: Self-pay | Admitting: Emergency Medicine

## 2022-12-01 ENCOUNTER — Emergency Department (HOSPITAL_BASED_OUTPATIENT_CLINIC_OR_DEPARTMENT_OTHER): Payer: BC Managed Care – PPO | Admitting: Radiology

## 2022-12-01 ENCOUNTER — Emergency Department (HOSPITAL_BASED_OUTPATIENT_CLINIC_OR_DEPARTMENT_OTHER)
Admission: EM | Admit: 2022-12-01 | Discharge: 2022-12-01 | Disposition: A | Discharge status: Home / Self Care | Payer: BC Managed Care – PPO

## 2022-12-01 ENCOUNTER — Other Ambulatory Visit: Payer: Self-pay

## 2022-12-01 DIAGNOSIS — I10 Essential (primary) hypertension: Secondary | ICD-10-CM | POA: Insufficient documentation

## 2022-12-01 DIAGNOSIS — Z79899 Other long term (current) drug therapy: Secondary | ICD-10-CM | POA: Insufficient documentation

## 2022-12-01 DIAGNOSIS — R0789 Other chest pain: Secondary | ICD-10-CM | POA: Insufficient documentation

## 2022-12-01 DIAGNOSIS — Z7982 Long term (current) use of aspirin: Secondary | ICD-10-CM | POA: Diagnosis not present

## 2022-12-01 DIAGNOSIS — Z720 Tobacco use: Secondary | ICD-10-CM | POA: Diagnosis not present

## 2022-12-01 DIAGNOSIS — R079 Chest pain, unspecified: Secondary | ICD-10-CM | POA: Diagnosis not present

## 2022-12-01 LAB — BASIC METABOLIC PANEL
Anion gap: 9 (ref 5–15)
BUN: 17 mg/dL (ref 6–20)
CO2: 24 mmol/L (ref 22–32)
Calcium: 9.9 mg/dL (ref 8.9–10.3)
Chloride: 105 mmol/L (ref 98–111)
Creatinine, Ser: 0.91 mg/dL (ref 0.61–1.24)
GFR, Estimated: >60 mL/min (ref >60)
Glucose, Bld: 99 mg/dL (ref 70–99)
Potassium: 4.4 mmol/L (ref 3.5–5.1)
Sodium: 138 mmol/L (ref 135–145)

## 2022-12-01 LAB — CBC
HCT: 47.1 % (ref 39.0–52.0)
Hemoglobin: 16.7 g/dL (ref 13.0–17.0)
MCH: 30 pg (ref 26.0–34.0)
MCHC: 35.5 g/dL (ref 30.0–36.0)
MCV: 84.7 fL (ref 80.0–100.0)
Platelets: 238 10*3/uL (ref 150–400)
RBC: 5.56 MIL/uL (ref 4.22–5.81)
RDW: 12.5 % (ref 11.5–15.5)
WBC: 7 10*3/uL (ref 4.0–10.5)
nRBC: 0 % (ref 0.0–0.2)

## 2022-12-01 LAB — TROPONIN I (HIGH SENSITIVITY): Troponin I (High Sensitivity): 3 ng/L (ref <18)

## 2022-12-01 MED ORDER — LIDOCAINE 5 % EX PTCH
1.0000 | MEDICATED_PATCH | CUTANEOUS | Status: DC
  Administered 2022-12-01: 1 via TRANSDERMAL
  Filled 2022-12-01: qty 1

## 2022-12-01 MED ORDER — KETOROLAC TROMETHAMINE 60 MG/2ML IM SOLN
30.0000 mg | Freq: Once | INTRAMUSCULAR | Status: AC
Start: 2022-12-01 — End: 2022-12-01
  Administered 2022-12-01: 30 mg via INTRAMUSCULAR
  Filled 2022-12-01: qty 2

## 2022-12-01 NOTE — Discharge Instructions (Signed)
As discussed, your labs and imaging are reassuring.  Your troponin is negative and your EKG does not show any ischemic changes indicating a heart attack.  You can take NSAIDs like ibuprofen every 6-8 hours as needed for pain.  You can also apply a lidocaine patch to your chest at the area where it hurts once a day for relief.  Please follow-up with your PCP in the next 5 to 7 days for reevaluation of your symptoms.  Get help right away if: You feel sick to your stomach (nauseous) or you throw up (vomit). You feel sweaty or light-headed. You have a cough with mucus from your lungs (sputum) or you cough up blood. You are short of breath.

## 2022-12-01 NOTE — ED Triage Notes (Signed)
Chest pain into arm and neck starte few days ago. Some Sob. Not relieved with ibuprofen today

## 2022-12-01 NOTE — ED Provider Notes (Signed)
[place] EMERGENCY DEPARTMENT AT [facility] Provider Note   CSN: [id number] Arrival date & time: 12/01/22  1432     History  Chief Complaint  Patient presents with   Chest Pain    Gregory Villarreal is a 52 y.o. male with a history of hypertension dyslipidemia presents the ED today for chest pain.  Patient states that he is had pain in his left arm for the past several days and then developed intermittent pressure in the left side of his chest that is worse with deep breaths and movement.  The pain also radiated to left side of his neck.  He took Ibuprofen yesterday with relief of symptoms but this morning the medication did not help so he came here for further evaluation.  He reports history of atypical chest pain in the past and was evaluated by cardiology who said that the pain was most likely due to impingement syndrome of his left shoulder.  Patient has not regular follow-up with cardiology.  Denies any recent injuries or trauma to his shoulder or chest.  No additional complaints or concerns at this time.    Home Medications Prior to Admission medications   Medication Sig Start Date End Date Taking? Authorizing Provider  Artificial Tear Solution (OPTI-TEARS OP) Apply 1 drop to eye as needed (dry eyes).    [provider]  aspirin EC 81 MG tablet Take 81 mg by mouth daily as needed for moderate pain.    [provider]  ibuprofen (ADVIL,MOTRIN) 800 MG tablet Take 1 tablet (800 mg total) by mouth 2 (two) times daily. Take for 2 weeks. 09/19/16   Hilty, Lisette Abu, MD  lisinopril (PRINIVIL,ZESTRIL) 10 MG tablet Take 10 mg by mouth daily. 07/12/15   [provider]  Multiple Vitamin (MULTIVITAMIN) tablet Take 1 tablet by mouth daily.    [provider]  ranitidine (ZANTAC) 150 MG tablet Take 150 mg by mouth 2 (two) times daily.    [provider]      Allergies    Patient has no known allergies.    Review of Systems   Review of  Systems  Cardiovascular:  Positive for chest pain.  All other systems reviewed and are negative.   Physical Exam Updated Vital Signs BP (!) 158/101 (BP Location: Right Arm)   Pulse (!) 58   Temp 98.4 F (36.9 C) (Oral)   Resp 18   SpO2 99%  Physical Exam Vitals and nursing note reviewed.  Constitutional:      Appearance: Normal appearance.  HENT:     Head: Normocephalic and atraumatic.     Mouth/Throat:     Mouth: Mucous membranes are moist.  Eyes:     Conjunctiva/sclera: Conjunctivae normal.     Pupils: Pupils are equal, round, and reactive to light.  Cardiovascular:     Rate and Rhythm: Normal rate and regular rhythm.     Pulses: Normal pulses.     Heart sounds: Normal heart sounds.  Pulmonary:     Effort: Pulmonary effort is normal.     Breath sounds: Normal breath sounds.  Abdominal:     Palpations: Abdomen is soft.     Tenderness: There is no abdominal tenderness.  Musculoskeletal:        General: Normal range of motion.  Skin:    General: Skin is warm and dry.     Findings: No rash.  Neurological:     General: No focal deficit present.     Mental  Status: He is alert.     Sensory: No sensory deficit.     Motor: No weakness.  Psychiatric:        Mood and Affect: Mood normal.        Behavior: Behavior normal.    ED Results / Procedures / Treatments   Labs (all labs ordered are listed, but only abnormal results are displayed) Labs Reviewed  BASIC METABOLIC PANEL  CBC  TROPONIN I (HIGH SENSITIVITY)    EKG EKG Interpretation Date/Time:  Friday December 01 2022 14:42:26 EST Ventricular Rate:  58 PR Interval:  148 QRS Duration:  90 QT Interval:  422 QTC Calculation: 414 R Axis:   -10  Text Interpretation: Sinus bradycardia with Premature atrial complexes Otherwise normal ECG When compared with ECG of 09-Sep-2016 21:09, Premature atrial complexes are now Present Confirmed by Alona Bene [phone]) on 12/01/2022 2:54:00 PM  Radiology DG Chest 2  View  Result Date: 12/01/2022 CLINICAL DATA:  Chest pain for 2 days. EXAM: CHEST - 2 VIEW COMPARISON:  09/09/2016 FINDINGS: The cardiomediastinal contours are normal. The lungs are clear. Pulmonary vasculature is normal. No consolidation, pleural effusion, or pneumothorax. Slight scoliosis and thoracic spondylosis. No acute osseous abnormalities are seen. IMPRESSION: No active cardiopulmonary disease. Electronically Signed   By: Narda Rutherford M.D.   On: 12/01/2022 17:30    Procedures Procedures: not indicated.   Medications Ordered in ED Medications  lidocaine (LIDODERM) 5 % 1 patch (1 patch Transdermal Patch Applied 12/01/22 1844)  ketorolac (TORADOL) injection 30 mg (30 mg Intramuscular Given 12/01/22 1842)    ED Course/ Medical Decision Making/ A&P                                 Medical Decision Making Amount and/or Complexity of Data Reviewed Labs: ordered. Radiology: ordered.  Risk Prescription drug management.   This patient presents to the ED for concern of chest pain, this involves an extensive number of treatment options, and is a complaint that carries with it a high risk of complications and morbidity.   Differential diagnosis includes: ACS, pneumonia, pneumothorax, pleurisy, costochondritis, muscle strain, etc.   Comorbidities  See HPI above   Additional History  Additional history obtained from prior records.   Cardiac Monitoring / EKG  The patient was maintained on a cardiac monitor.  I personally viewed and interpreted the cardiac monitored which showed: Sinus bradycardia with PACs, heart rate of 58 bpm.   Lab Tests  I ordered and personally interpreted labs.  The pertinent results include:   BMP and CBC are unremarkable Negative troponin   Imaging Studies  I ordered imaging studies including CXR  I independently visualized and interpreted imaging which showed: No acute part cardiopulmonary processes. I agree with the radiologist  interpretation   Problem List / ED Course / Critical Interventions / Medication Management  Chest pain I ordered medications including: Lidocaine patch and IM Toradol for pain - given prior to discharge I have reviewed the patients home medicines and have made adjustments as needed   Social Determinants of Health  Tobacco use    Test / Admission - Considered  Discussed findings with patient.  All questions were answered. He is hemodynamically stable and safe discharge home. Return precautions provided.       Final Clinical Impression(s) / ED Diagnoses Final diagnoses:  Chest wall pain    Rx / DC Orders ED Discharge Orders  None         Maxwell Marion, PA-C 12/01/22 2323    Coral Spikes, DO 12/02/22 0004

## 2022-12-07 ENCOUNTER — Other Ambulatory Visit: Payer: Self-pay | Admitting: Internal Medicine

## 2022-12-07 DIAGNOSIS — E785 Hyperlipidemia, unspecified: Secondary | ICD-10-CM

## 2023-04-17 DIAGNOSIS — M62838 Other muscle spasm: Secondary | ICD-10-CM | POA: Diagnosis not present

## 2023-04-17 DIAGNOSIS — M791 Myalgia, unspecified site: Secondary | ICD-10-CM | POA: Diagnosis not present

## 2023-04-17 DIAGNOSIS — Z03818 Encounter for observation for suspected exposure to other biological agents ruled out: Secondary | ICD-10-CM | POA: Diagnosis not present

## 2023-11-07 DIAGNOSIS — Z1389 Encounter for screening for other disorder: Secondary | ICD-10-CM | POA: Diagnosis not present

## 2023-11-07 DIAGNOSIS — R1031 Right lower quadrant pain: Secondary | ICD-10-CM | POA: Diagnosis not present

## 2023-11-07 DIAGNOSIS — R82998 Other abnormal findings in urine: Secondary | ICD-10-CM | POA: Diagnosis not present

## 2023-11-07 DIAGNOSIS — M2550 Pain in unspecified joint: Secondary | ICD-10-CM | POA: Diagnosis not present

## 2023-11-07 DIAGNOSIS — Z125 Encounter for screening for malignant neoplasm of prostate: Secondary | ICD-10-CM | POA: Diagnosis not present

## 2023-11-07 DIAGNOSIS — I10 Essential (primary) hypertension: Secondary | ICD-10-CM | POA: Diagnosis not present

## 2024-01-03 DIAGNOSIS — M79641 Pain in right hand: Secondary | ICD-10-CM | POA: Diagnosis not present

## 2024-01-03 DIAGNOSIS — M5459 Other low back pain: Secondary | ICD-10-CM | POA: Diagnosis not present

## 2024-01-03 DIAGNOSIS — M25551 Pain in right hip: Secondary | ICD-10-CM | POA: Diagnosis not present

## 2024-01-03 DIAGNOSIS — M79642 Pain in left hand: Secondary | ICD-10-CM | POA: Diagnosis not present
